# Patient Record
Sex: Male | Born: 1985 | Race: White | Hispanic: No | Marital: Single | Smoking: Current every day smoker
Health system: Southern US, Community
[De-identification: ages and names within clinical notes are randomized; demographics above are authoritative.]

---

## 2005-09-27 ENCOUNTER — Emergency Department (HOSPITAL_COMMUNITY): Admission: EM | Admit: 2005-09-27 | Discharge: 2005-09-27 | Payer: Self-pay | Admitting: Emergency Medicine

## 2006-09-04 ENCOUNTER — Ambulatory Visit: Payer: Self-pay | Admitting: Cardiology

## 2006-09-04 ENCOUNTER — Ambulatory Visit: Payer: Self-pay

## 2006-09-04 LAB — CONVERTED CEMR LAB
Basophils Relative: 0.6 % (ref 0.0–1.0)
Hemoglobin: 16.4 g/dL (ref 13.0–17.0)
Lymphocytes Relative: 25.6 % (ref 12.0–46.0)
MCHC: 34.8 g/dL (ref 30.0–36.0)
Monocytes Relative: 10.7 % (ref 3.0–11.0)
Neutrophils Relative %: 56.1 % (ref 43.0–77.0)
RBC: 5.29 M/uL (ref 4.22–5.81)
WBC: 7.7 10*3/uL (ref 4.5–10.5)

## 2012-11-21 ENCOUNTER — Encounter (HOSPITAL_COMMUNITY)
Admission: EM | Disposition: A | Discharge status: Home / Self Care | Payer: Self-pay | Source: Home / Self Care | Attending: Emergency Medicine

## 2012-11-21 ENCOUNTER — Encounter (HOSPITAL_COMMUNITY): Payer: Self-pay | Admitting: Emergency Medicine

## 2012-11-21 ENCOUNTER — Encounter (HOSPITAL_COMMUNITY): Payer: Self-pay | Admitting: Anesthesiology

## 2012-11-21 ENCOUNTER — Emergency Department (HOSPITAL_COMMUNITY): Payer: Self-pay

## 2012-11-21 ENCOUNTER — Observation Stay (HOSPITAL_COMMUNITY)
Admission: EM | Admit: 2012-11-21 | Discharge: 2012-11-22 | Disposition: A | Discharge status: Home / Self Care | Payer: MEDICAID | Attending: Orthopedic Surgery | Admitting: Orthopedic Surgery

## 2012-11-21 ENCOUNTER — Emergency Department (HOSPITAL_COMMUNITY): Payer: Self-pay | Admitting: Anesthesiology

## 2012-11-21 DIAGNOSIS — Y990 Civilian activity done for income or pay: Secondary | ICD-10-CM | POA: Insufficient documentation

## 2012-11-21 DIAGNOSIS — W309XXA Contact with unspecified agricultural machinery, initial encounter: Secondary | ICD-10-CM | POA: Insufficient documentation

## 2012-11-21 DIAGNOSIS — S82202B Unspecified fracture of shaft of left tibia, initial encounter for open fracture type I or II: Secondary | ICD-10-CM

## 2012-11-21 DIAGNOSIS — Y9339 Activity, other involving climbing, rappelling and jumping off: Secondary | ICD-10-CM | POA: Insufficient documentation

## 2012-11-21 DIAGNOSIS — S82209B Unspecified fracture of shaft of unspecified tibia, initial encounter for open fracture type I or II: Principal | ICD-10-CM | POA: Insufficient documentation

## 2012-11-21 HISTORY — PX: I&D EXTREMITY: SHX5045

## 2012-11-21 HISTORY — PX: TIBIA IM NAIL INSERTION: SHX2516

## 2012-11-21 LAB — POCT I-STAT, CHEM 8
BUN: 13 mg/dL (ref 6–23)
Creatinine, Ser: 1 mg/dL (ref 0.50–1.35)
Glucose, Bld: 89 mg/dL (ref 70–99)
Hemoglobin: 13.9 g/dL (ref 13.0–17.0)
Potassium: 3.6 mEq/L (ref 3.5–5.1)
Sodium: 142 mEq/L (ref 135–145)

## 2012-11-21 LAB — CBC WITH DIFFERENTIAL/PLATELET
Basophils Absolute: 0.1 10*3/uL (ref 0.0–0.1)
Basophils Relative: 1 % (ref 0–1)
HCT: 41 % (ref 39.0–52.0)
Hemoglobin: 14.5 g/dL (ref 13.0–17.0)
Monocytes Absolute: 0.6 10*3/uL (ref 0.1–1.0)
Neutrophils Relative %: 70 % (ref 43–77)
Platelets: 225 10*3/uL (ref 150–400)
RBC: 4.84 MIL/uL (ref 4.22–5.81)
RDW: 13.8 % (ref 11.5–15.5)

## 2012-11-21 SURGERY — INSERTION, INTRAMEDULLARY ROD, TIBIA
Anesthesia: General | Site: Leg Lower | Laterality: Left | Wound class: Dirty or Infected

## 2012-11-21 MED ORDER — WARFARIN SODIUM 10 MG PO TABS
10.0000 mg | ORAL_TABLET | Freq: Once | ORAL | Status: AC
  Administered 2012-11-22: 10 mg via ORAL
  Filled 2012-11-21: qty 1

## 2012-11-21 MED ORDER — HYDROMORPHONE HCL PF 1 MG/ML IJ SOLN
1.0000 mg | Freq: Once | INTRAMUSCULAR | Status: AC
  Administered 2012-11-21: 1 mg via INTRAVENOUS
  Administered 2012-11-21: 2 mg via INTRAVENOUS

## 2012-11-21 MED ORDER — PATIENT'S GUIDE TO USING COUMADIN BOOK
Freq: Once | Status: AC
  Administered 2012-11-21
  Filled 2012-11-21: qty 1

## 2012-11-21 MED ORDER — MIDAZOLAM HCL 2 MG/2ML IJ SOLN: 0.5000 mg | Freq: Once | INTRAMUSCULAR | Status: DC | PRN

## 2012-11-21 MED ORDER — METOCLOPRAMIDE HCL 10 MG PO TABS: 5.0000 mg | ORAL_TABLET | Freq: Three times a day (TID) | ORAL | Status: DC | PRN

## 2012-11-21 MED ORDER — PROPOFOL 10 MG/ML IV BOLUS
INTRAVENOUS | Status: DC | PRN
  Administered 2012-11-21: 200 mg via INTRAVENOUS

## 2012-11-21 MED ORDER — OXYCODONE HCL 5 MG PO TABS
5.0000 mg | ORAL_TABLET | ORAL | Status: DC | PRN
  Administered 2012-11-22 (×5): 10 mg via ORAL
  Filled 2012-11-21 (×5): qty 2

## 2012-11-21 MED ORDER — SODIUM CHLORIDE 0.9 % IV SOLN
INTRAVENOUS | Status: DC | PRN
  Administered 2012-11-21 (×2): via INTRAVENOUS

## 2012-11-21 MED ORDER — FENTANYL CITRATE 0.05 MG/ML IJ SOLN
INTRAMUSCULAR | Status: AC
  Administered 2012-11-21: 100 ug via INTRAVENOUS
  Filled 2012-11-21: qty 2

## 2012-11-21 MED ORDER — ONDANSETRON HCL 4 MG/2ML IJ SOLN: 4.0000 mg | Freq: Four times a day (QID) | INTRAMUSCULAR | Status: DC | PRN

## 2012-11-21 MED ORDER — SODIUM CHLORIDE 0.9 % IR SOLN
Status: DC | PRN
  Administered 2012-11-21: 3000 mL

## 2012-11-21 MED ORDER — CEFAZOLIN SODIUM 1-5 GM-% IV SOLN
INTRAVENOUS | Status: DC | PRN
  Administered 2012-11-21: 1 g via INTRAVENOUS

## 2012-11-21 MED ORDER — 0.9 % SODIUM CHLORIDE (POUR BTL) OPTIME
TOPICAL | Status: DC | PRN
  Administered 2012-11-21: 1000 mL

## 2012-11-21 MED ORDER — VECURONIUM BROMIDE 10 MG IV SOLR
INTRAVENOUS | Status: DC | PRN
  Administered 2012-11-21: 10 mg via INTRAVENOUS

## 2012-11-21 MED ORDER — HYDROMORPHONE HCL PF 1 MG/ML IJ SOLN
INTRAMUSCULAR | Status: AC
  Administered 2012-11-21: 2 mg via INTRAVENOUS
  Filled 2012-11-21: qty 2

## 2012-11-21 MED ORDER — HYDROMORPHONE HCL PF 1 MG/ML IJ SOLN: 1.0000 mg | Freq: Once | INTRAMUSCULAR | Status: DC

## 2012-11-21 MED ORDER — WARFARIN VIDEO
Freq: Once | Status: AC
  Administered 2012-11-22: 14:00:00

## 2012-11-21 MED ORDER — GLYCOPYRROLATE 0.2 MG/ML IJ SOLN
INTRAMUSCULAR | Status: DC | PRN
  Administered 2012-11-21: 0.6 mg via INTRAVENOUS

## 2012-11-21 MED ORDER — FENTANYL CITRATE 0.05 MG/ML IJ SOLN
100.0000 ug | Freq: Once | INTRAMUSCULAR | Status: AC
  Administered 2012-11-21: 100 ug via INTRAVENOUS

## 2012-11-21 MED ORDER — LIDOCAINE HCL (CARDIAC) 20 MG/ML IV SOLN
INTRAVENOUS | Status: DC | PRN
  Administered 2012-11-21: 100 mg via INTRAVENOUS
  Administered 2012-11-21: 50 mg via INTRAVENOUS

## 2012-11-21 MED ORDER — DEXAMETHASONE SODIUM PHOSPHATE 4 MG/ML IJ SOLN
INTRAMUSCULAR | Status: DC | PRN
  Administered 2012-11-21: 12 mg via INTRAVENOUS

## 2012-11-21 MED ORDER — ONDANSETRON HCL 4 MG PO TABS
4.0000 mg | ORAL_TABLET | Freq: Four times a day (QID) | ORAL | Status: DC | PRN
  Administered 2012-11-22: 4 mg via ORAL
  Filled 2012-11-21: qty 1

## 2012-11-21 MED ORDER — HYDROMORPHONE HCL PF 1 MG/ML IJ SOLN
1.0000 mg | INTRAMUSCULAR | Status: DC | PRN
  Administered 2012-11-22 (×5): 1 mg via INTRAVENOUS
  Filled 2012-11-21 (×6): qty 1

## 2012-11-21 MED ORDER — SODIUM CHLORIDE 0.9 % IV SOLN: INTRAVENOUS | Status: DC

## 2012-11-21 MED ORDER — FENTANYL CITRATE 0.05 MG/ML IJ SOLN
INTRAMUSCULAR | Status: AC
  Administered 2012-11-21: 100 ug
  Filled 2012-11-21: qty 2

## 2012-11-21 MED ORDER — CEFAZOLIN SODIUM 1-5 GM-% IV SOLN
1.0000 g | Freq: Four times a day (QID) | INTRAVENOUS | Status: AC
  Administered 2012-11-22 (×3): 1 g via INTRAVENOUS
  Filled 2012-11-21 (×3): qty 50

## 2012-11-21 MED ORDER — MEPERIDINE HCL 25 MG/ML IJ SOLN: 6.2500 mg | INTRAMUSCULAR | Status: DC | PRN

## 2012-11-21 MED ORDER — CEFAZOLIN SODIUM 1-5 GM-% IV SOLN
1.0000 g | Freq: Once | INTRAVENOUS | Status: AC
  Administered 2012-11-21: 1 g via INTRAVENOUS
  Filled 2012-11-21: qty 50

## 2012-11-21 MED ORDER — HYDROCODONE-ACETAMINOPHEN 10-325 MG PO TABS
1.0000 | ORAL_TABLET | ORAL | Status: DC | PRN
  Administered 2012-11-22 (×4): 2 via ORAL
  Filled 2012-11-21 (×4): qty 2

## 2012-11-21 MED ORDER — SODIUM CHLORIDE 0.9 % IV BOLUS (SEPSIS)
1000.0000 mL | Freq: Once | INTRAVENOUS | Status: AC
  Administered 2012-11-21: 1000 mL via INTRAVENOUS

## 2012-11-21 MED ORDER — OXYCODONE HCL 5 MG/5ML PO SOLN: 5.0000 mg | Freq: Once | ORAL | Status: AC | PRN

## 2012-11-21 MED ORDER — HYDROMORPHONE HCL PF 1 MG/ML IJ SOLN
INTRAMUSCULAR | Status: DC | PRN
  Administered 2012-11-21 (×4): .25 mg via INTRAVENOUS

## 2012-11-21 MED ORDER — OXYCODONE HCL 5 MG PO TABS
ORAL_TABLET | ORAL | Status: AC
  Filled 2012-11-21: qty 1

## 2012-11-21 MED ORDER — ARTIFICIAL TEARS OP OINT
TOPICAL_OINTMENT | OPHTHALMIC | Status: DC | PRN
  Administered 2012-11-21: 1 via OPHTHALMIC

## 2012-11-21 MED ORDER — WARFARIN - PHARMACIST DOSING INPATIENT: Freq: Every day | Status: DC

## 2012-11-21 MED ORDER — HYDROMORPHONE HCL PF 1 MG/ML IJ SOLN
0.2500 mg | INTRAMUSCULAR | Status: DC | PRN
  Administered 2012-11-21 (×4): 0.5 mg via INTRAVENOUS

## 2012-11-21 MED ORDER — FENTANYL CITRATE 0.05 MG/ML IJ SOLN
INTRAMUSCULAR | Status: DC | PRN
  Administered 2012-11-21: 150 ug via INTRAVENOUS
  Administered 2012-11-21: 250 ug via INTRAVENOUS
  Administered 2012-11-21: 100 ug via INTRAVENOUS

## 2012-11-21 MED ORDER — METOCLOPRAMIDE HCL 5 MG/ML IJ SOLN: 5.0000 mg | Freq: Three times a day (TID) | INTRAMUSCULAR | Status: DC | PRN

## 2012-11-21 MED ORDER — HYDROMORPHONE HCL PF 1 MG/ML IJ SOLN
INTRAMUSCULAR | Status: AC
  Filled 2012-11-21: qty 1

## 2012-11-21 MED ORDER — LACTATED RINGERS IV SOLN
INTRAVENOUS | Status: DC | PRN
  Administered 2012-11-21 (×2): via INTRAVENOUS

## 2012-11-21 MED ORDER — PROMETHAZINE HCL 25 MG/ML IJ SOLN: 6.2500 mg | INTRAMUSCULAR | Status: DC | PRN

## 2012-11-21 MED ORDER — ONDANSETRON HCL 4 MG/2ML IJ SOLN
INTRAMUSCULAR | Status: DC | PRN
  Administered 2012-11-21: 4 mg via INTRAVENOUS

## 2012-11-21 MED ORDER — NEOSTIGMINE METHYLSULFATE 1 MG/ML IJ SOLN
INTRAMUSCULAR | Status: DC | PRN
  Administered 2012-11-21: 5 mg via INTRAVENOUS

## 2012-11-21 MED ORDER — DEXTROSE 5 % IV SOLN
INTRAVENOUS | Status: DC | PRN
  Administered 2012-11-21: 20:00:00 via INTRAVENOUS

## 2012-11-21 MED ORDER — HYDROMORPHONE HCL PF 1 MG/ML IJ SOLN
INTRAMUSCULAR | Status: AC
  Administered 2012-11-22: 1 mg
  Filled 2012-11-21: qty 1

## 2012-11-21 MED ORDER — MIDAZOLAM HCL 5 MG/5ML IJ SOLN
INTRAMUSCULAR | Status: DC | PRN
  Administered 2012-11-21: 2 mg via INTRAVENOUS

## 2012-11-21 MED ORDER — OXYCODONE HCL 5 MG PO TABS
5.0000 mg | ORAL_TABLET | Freq: Once | ORAL | Status: AC | PRN
  Administered 2012-11-21: 5 mg via ORAL

## 2012-11-21 MED ORDER — SUCCINYLCHOLINE CHLORIDE 20 MG/ML IJ SOLN
INTRAMUSCULAR | Status: DC | PRN
  Administered 2012-11-21: 100 mg via INTRAVENOUS

## 2012-11-21 SURGICAL SUPPLY — 60 items
BANDAGE ELASTIC 6 VELCRO ST LF (GAUZE/BANDAGES/DRESSINGS) ×1 IMPLANT
BANDAGE GAUZE ELAST BULKY 4 IN (GAUZE/BANDAGES/DRESSINGS) ×2 IMPLANT
BIT DRILL LONG 4.0 (BIT) IMPLANT
BIT DRILL SHORT 4.0 (BIT) IMPLANT
BLADE SURG 10 STRL SS (BLADE) ×1 IMPLANT
BNDG COHESIVE 4X5 TAN STRL (GAUZE/BANDAGES/DRESSINGS) ×1 IMPLANT
BNDG COHESIVE 6X5 TAN STRL LF (GAUZE/BANDAGES/DRESSINGS) ×4 IMPLANT
CLOTH BEACON ORANGE TIMEOUT ST (SAFETY) ×2 IMPLANT
CO AXIAL FAN SPRAY TIP SOFT SH (MISCELLANEOUS) ×1 IMPLANT
COVER SURGICAL LIGHT HANDLE (MISCELLANEOUS) ×4 IMPLANT
CUFF TOURNIQUET SINGLE 34IN LL (TOURNIQUET CUFF) IMPLANT
CUFF TOURNIQUET SINGLE 44IN (TOURNIQUET CUFF) IMPLANT
DRAPE C-ARM 42X72 X-RAY (DRAPES) ×1 IMPLANT
DRAPE C-ARM MINI 42X72 WSTRAPS (DRAPES) ×1 IMPLANT
DRAPE U-SHAPE 47X51 STRL (DRAPES) ×2 IMPLANT
DRILL BIT LONG 4.0 (BIT) ×2
DRILL BIT SHORT 4.0 (BIT) ×4
DRSG ADAPTIC 3X8 NADH LF (GAUZE/BANDAGES/DRESSINGS) ×2 IMPLANT
DRSG PAD ABDOMINAL 8X10 ST (GAUZE/BANDAGES/DRESSINGS) ×1 IMPLANT
ELECT REM PT RETURN 9FT ADLT (ELECTROSURGICAL) ×2
ELECTRODE REM PT RTRN 9FT ADLT (ELECTROSURGICAL) ×1 IMPLANT
FLUID NSS /IRRIG 3000 ML XXX (IV SOLUTION) ×1 IMPLANT
GLOVE BIOGEL PI IND STRL 9 (GLOVE) ×1 IMPLANT
GLOVE BIOGEL PI INDICATOR 9 (GLOVE) ×1
GLOVE SURG ORTHO 9.0 STRL STRW (GLOVE) ×2 IMPLANT
GOWN PREVENTION PLUS XLARGE (GOWN DISPOSABLE) ×2 IMPLANT
GOWN SRG XL XLNG 56XLVL 4 (GOWN DISPOSABLE) ×2 IMPLANT
GOWN STRL NON-REIN XL XLG LVL4 (GOWN DISPOSABLE) ×4
GUIDE PIN 3.2MM (MISCELLANEOUS) ×2
GUIDE PIN ORTH 343X3.2XBRAD (MISCELLANEOUS) IMPLANT
GUIDE ROD 3.0 (MISCELLANEOUS) ×2
HANDPIECE INTERPULSE COAX TIP (DISPOSABLE) ×2
KIT BASIN OR (CUSTOM PROCEDURE TRAY) ×2 IMPLANT
KIT ROOM TURNOVER OR (KITS) ×2 IMPLANT
NAIL TIBIAL 10X37 (Nail) ×1 IMPLANT
NS IRRIG 1000ML POUR BTL (IV SOLUTION) ×2 IMPLANT
PACK ORTHO EXTREMITY (CUSTOM PROCEDURE TRAY) ×2 IMPLANT
PAD ARMBOARD 7.5X6 YLW CONV (MISCELLANEOUS) ×4 IMPLANT
PAD CAST 4YDX4 CTTN HI CHSV (CAST SUPPLIES) ×1 IMPLANT
PADDING CAST COTTON 4X4 STRL (CAST SUPPLIES)
PENCIL BUTTON HOLSTER BLD 10FT (ELECTRODE) ×2 IMPLANT
ROD GUIDE 3.0 (MISCELLANEOUS) IMPLANT
SCREW TRIGEN LOW PROF 5.0X32.5 (Screw) ×1 IMPLANT
SCREW TRIGEN LOW PROF 5.0X45 (Screw) ×1 IMPLANT
SET HNDPC FAN SPRY TIP SCT (DISPOSABLE) IMPLANT
SPONGE GAUZE 4X4 12PLY (GAUZE/BANDAGES/DRESSINGS) ×2 IMPLANT
SPONGE LAP 18X18 X RAY DECT (DISPOSABLE) ×2 IMPLANT
STAPLER VISISTAT 35W (STAPLE) ×2 IMPLANT
STOCKINETTE IMPERVIOUS LG (DRAPES) ×2 IMPLANT
SUCTION FRAZIER TIP 10 FR DISP (SUCTIONS) ×1 IMPLANT
SUT ETHILON 2 0 FS 18 (SUTURE) ×2 IMPLANT
SUT ETHILON 2 0 PSLX (SUTURE) ×1 IMPLANT
SUT VIC AB 0 CTB1 27 (SUTURE) ×2 IMPLANT
SUT VIC AB 2-0 CTB1 (SUTURE) ×2 IMPLANT
SYR BULB IRRIGATION 50ML (SYRINGE) ×1 IMPLANT
TOWEL OR 17X24 6PK STRL BLUE (TOWEL DISPOSABLE) ×2 IMPLANT
TOWEL OR 17X26 10 PK STRL BLUE (TOWEL DISPOSABLE) ×2 IMPLANT
TUBE CONNECTING 12X1/4 (SUCTIONS) ×2 IMPLANT
WATER STERILE IRR 1000ML POUR (IV SOLUTION) ×1 IMPLANT
YANKAUER SUCT BULB TIP NO VENT (SUCTIONS) ×1 IMPLANT

## 2012-11-21 NOTE — ED Notes (Addendum)
Pt presents to ED via EMS . Pt was cutting wood and a large log  hit his left leg. PT has open fracture to left lower leg. EMS gave 250 fentanyl placed 2 PIV's. EMS states EBL at 50-100.

## 2012-11-21 NOTE — Progress Notes (Signed)
ANTICOAGULATION CONSULT NOTE - Initial Consult  Pharmacy Consult for coumadin Indication: VTE prophylaxis  No Known Allergies  Patient Measurements: Height: 6\' 2"  (188 cm) (per pt report) Weight: 185 lb (83.915 kg) (per pt report) IBW/kg (Calculated) : 82.2   Vital Signs: Temp: 97.8 F (36.6 C) (05/25 2134) Temp src: Oral (05/25 1745) BP: 125/63 mmHg (05/25 2145) Pulse Rate: 57 (05/25 2145)  Labs:  Recent Labs  11/21/12 1752 11/21/12 1802  HGB 14.5 13.9  HCT 41.0 41.0  PLT 225  --   CREATININE  --  1.00    Estimated Creatinine Clearance: 127.9 ml/min (by C-G formula based on Cr of 1).   Medical History: History reviewed. No pertinent past medical history.  Medications:  No prescriptions prior to admission    Assessment: Douglas Cummings is a 27 yo man s/p IM nail tibia to repair open tib/fib fracture sustained while cutting down a tree.  Pharmacy has been asked to dose coumadin for VTE px.   He states his ht is 12ft2in and his wt is 185 pounds.   Baseline CBC wnl.  Goal of Therapy:  INR 2-3 Monitor platelets by anticoagulation protocol: Yes   Plan:  1. Coumadin 10 mg po x 1 dose tonight 2. Daily INR 3. Coumadin book and video for education Herby Abraham, Pharm.D. [phone] 11/21/2012 10:04 PM

## 2012-11-21 NOTE — Progress Notes (Signed)
Orthopedic Tech Progress Note Patient Details:  Douglas Cummings [birth year] [id number]  Patient ID: Douglas Cummings, male   DOB: 10/10/1985, 27 y.o.   MRN: [id number] Made level 2 trauma visit  Nikki Dom 11/21/2012, 5:39 PM

## 2012-11-21 NOTE — Transfer of Care (Signed)
Immediate Anesthesia Transfer of Care Note  Patient: Senai Kingsley Storr  Procedure(s) Performed: Procedure(s) with comments: INTRAMEDULLARY (IM) NAIL TIBIAL (Left) IRRIGATION AND DEBRIDEMENT EXTREMITY (Left) - open wound left tibia  Patient Location: PACU  Anesthesia Type:General  Level of Consciousness: oriented, sedated, patient cooperative and responds to stimulation  Airway & Oxygen Therapy: Patient Spontanous Breathing and Patient connected to nasal cannula oxygen  Post-op Assessment: Report given to PACU RN, Post -op Vital signs reviewed and stable, Patient moving all extremities and Patient moving all extremities X 4  Post vital signs: Reviewed and stable  Complications: No apparent anesthesia complications

## 2012-11-21 NOTE — Anesthesia Postprocedure Evaluation (Signed)
  Anesthesia Post-op Note  Patient: Douglas Cummings  Procedure(s) Performed: Procedure(s) with comments: INTRAMEDULLARY (IM) NAIL TIBIAL (Left) IRRIGATION AND DEBRIDEMENT EXTREMITY (Left) - open wound left tibia  Patient Location: PACU  Anesthesia Type:General  Level of Consciousness: sedated, patient cooperative and responds to stimulation  Airway and Oxygen Therapy: Patient Spontanous Breathing and Patient connected to nasal cannula oxygen  Post-op Pain: none  Post-op Assessment: Post-op Vital signs reviewed, Patient's Cardiovascular Status Stable, Respiratory Function Stable, Patent Airway, No signs of Nausea or vomiting and Pain level controlled  Post-op Vital Signs: Reviewed and stable  Complications: No apparent anesthesia complications

## 2012-11-21 NOTE — Anesthesia Procedure Notes (Signed)
Procedure Name: Intubation Date/Time: 11/21/2012 8:01 PM Performed by: Wray Kearns A Pre-anesthesia Checklist: Patient identified, Timeout performed, Emergency Drugs available, Suction available and Patient being monitored Patient Re-evaluated:Patient Re-evaluated prior to inductionOxygen Delivery Method: Circle system utilized Preoxygenation: Pre-oxygenation with 100% oxygen Intubation Type: IV induction, Rapid sequence and Cricoid Pressure applied Laryngoscope Size: Mac and 4 Grade View: Grade I Tube type: Oral Tube size: 7.5 mm Number of attempts: 1 Airway Equipment and Method: Stylet Placement Confirmation: ETT inserted through vocal cords under direct vision,  breath sounds checked- equal and bilateral and positive ETCO2 Secured at: 23 cm Tube secured with: Tape Dental Injury: Teeth and Oropharynx as per pre-operative assessment

## 2012-11-21 NOTE — ED Notes (Signed)
Patient removed off back board per Dr Adriana Simas

## 2012-11-21 NOTE — Progress Notes (Signed)
Orthopedic Tech Progress Note Patient Details:  Douglas Cummings [birth year] [id number]  Ortho Devices Type of Ortho Device: Ace wrap;Post (short leg) splint Ortho Device/Splint Location: left leg Ortho Device/Splint Interventions: Application   Douglas Cummings 11/21/2012, 7:00 PM

## 2012-11-21 NOTE — Preoperative (Signed)
Beta Blockers   Reason not to administer Beta Blockers:Not Applicable 

## 2012-11-21 NOTE — H&P (Signed)
Douglas Cummings is an 27 y.o. male.   Chief Complaint: Open left tib-fib fracture HPI: Patient is a 28 year old gentleman who states that he works as a Geophysical data processor. He states he was cutting down a tree and the tree stand back sustaining an open fracture to the left tib-fib. Patient denies any other trauma. Patient denies drinking alcohol.  History reviewed. No pertinent past medical history.  History reviewed. No pertinent past surgical history.  History reviewed. No pertinent family history. Social History:  has no tobacco, alcohol, and drug history on file.  Allergies: No Known Allergies   (Not in a hospital admission)  Results for orders placed during the hospital encounter of 11/21/12 (from the past 48 hour(s))  CBC WITH DIFFERENTIAL     Status: None   Collection Time    11/21/12  5:52 PM      Result Value Range   WBC 7.3  4.0 - 10.5 K/uL   RBC 4.84  4.22 - 5.81 MIL/uL   Hemoglobin 14.5  13.0 - 17.0 g/dL   HCT 16.1  09.6 - 04.5 %   MCV 84.7  78.0 - 100.0 fL   MCH 30.0  26.0 - 34.0 pg   MCHC 35.4  30.0 - 36.0 g/dL   RDW 40.9  81.1 - 91.4 %   Platelets 225  150 - 400 K/uL   Neutrophils Relative % 70  43 - 77 %   Neutro Abs 5.1  1.7 - 7.7 K/uL   Lymphocytes Relative 19  12 - 46 %   Lymphs Abs 1.4  0.7 - 4.0 K/uL   Monocytes Relative 9  3 - 12 %   Monocytes Absolute 0.6  0.1 - 1.0 K/uL   Eosinophils Relative 1  0 - 5 %   Eosinophils Absolute 0.1  0.0 - 0.7 K/uL   Basophils Relative 1  0 - 1 %   Basophils Absolute 0.1  0.0 - 0.1 K/uL  POCT I-STAT, CHEM 8     Status: Abnormal   Collection Time    11/21/12  6:02 PM      Result Value Range   Sodium 142  135 - 145 mEq/L   Potassium 3.6  3.5 - 5.1 mEq/L   Chloride 107  96 - 112 mEq/L   BUN 13  6 - 23 mg/dL   Creatinine, Ser 7.82  0.50 - 1.35 mg/dL   Glucose, Bld 89  70 - 99 mg/dL   Calcium, Ion 9.56 (*) 1.12 - 1.23 mmol/L   TCO2 26  0 - 100 mmol/L   Hemoglobin 13.9  13.0 - 17.0 g/dL   HCT 21.3  08.6 - 57.8 %   Dg  Tibia/fibula Left Port  11/21/2012   *RADIOLOGY REPORT*  Clinical Data: Trauma. Open fracture.  PORTABLE LEFT TIBIA AND FIBULA - 2 VIEW  Comparison: None  Findings: There are comminuted fractures of the tibia fibula at the junction of the mid and distal thirds.  There is resulting valgus and posterior angulation.  The ankle and knee appear intact.  IMPRESSION: Comminuted tibial and fibular fractures.   Original Report Authenticated By: Norva Pavlov, M.D.    Review of Systems  All other systems reviewed and are negative.    Blood pressure 130/55, pulse 76, temperature 97.7 F (36.5 C), temperature source Oral, resp. rate 24, SpO2 100.00%. Physical Exam  On examination patient has an open laceration over the tib-fib. He has a good dorsalis pedis pulse. The calf is soft. Patient's has  good motor function in the left lower extremity has normal sensation. Review of the radiographs shows a mid-diaphyseal fracture of the left tib-fib. Patient complains of chronic lower back pain which he self medicates. Assessment/Plan Assessment: Open type IIIA left tib-fib fracture.  Plan: Will plan for irrigation debridement skin soft tissue muscle and bone. Placement of an intramedullary nail. Risks and benefits were discussed including infection neurovascular injury pain DVT pulmonary embolus need for additional surgery. Patient states he understands and wished to proceed at this time. Patient states that he takes Suboxone from a friend and we may have difficulty with postoperative pain control.   DUDA,MARCUS V 11/21/2012, 7:27 PM

## 2012-11-21 NOTE — Progress Notes (Signed)
Orthopedic Tech Progress Note Patient Details:  Douglas Cummings 03-Jan-1986 [id number]  Ortho Devices Type of Ortho Device: CAM walker Ortho Device/Splint Location: left foot Ortho Device/Splint Interventions: Application   Douglas Cummings 11/21/2012, 10:35 PM

## 2012-11-21 NOTE — Op Note (Signed)
OPERATIVE REPORT  DATE OF SURGERY: 11/21/2012  PATIENT:  Douglas Cummings,  27 y.o. male  PRE-OPERATIVE DIAGNOSIS:  open tib/fib fracture type IIIa  POST-OPERATIVE DIAGNOSIS:  open tib/fib fracture type IIIA  PROCEDURE:  Procedure(s): INTRAMEDULLARY (IM) NAIL TIBIAL [employer] nail 10 x 37 cm Locked proximally with a 45 mm screw. Locked distally with a 32.5 mm screw. Irrigation debridement skin soft tissue and bone with excision of loose bone fragments. Local tissue rearrangement for closure of traumatic wound 6 cm x 3 cm IRRIGATION AND DEBRIDEMENT EXTREMITY  SURGEON:  Surgeon(s): Nadara Mustard, MD  ANESTHESIA:   general  EBL:  Minimal ML  SPECIMEN:  No Specimen  TOURNIQUET:  * No tourniquets in log *  PROCEDURE DETAILS: Patient is a 27 year old gentleman who states he was cutting down a tree. The limb snapback struck him in his leg and he sustained an open type IIIa tib-fib fracture left leg. Patient was brought to the emergency room he had good pulses the compartments were soft he had an open type IIIa fracture radiographs were reviewed and patient presents at this time for urgent intervention. Patient received 1 g of Kefzol in the emergency room and 1 g Kefzol preoperatively. Risks and benefits were discussed with the patient including infection neurovascular injury pain DVT pulmonary embolus and need for additional surgery. Patient states he understands and wished to proceed at this time. Description of procedure patient brought to the operating room and underwent a general anesthetic. After adequate levels of anesthesia were obtained patient's left lower extremity was prepped using DuraPrep draped into a sterile field. The wound was first debrided of skin soft tissue a loose bony fragments. It was irrigated with pulsatile lavage 3 L. Necrotic tissue was removed there was no contamination of dirt. The greater saphenous vein was injured by the trauma and this was ligated. After  irrigation debridement of the traumatic wound. A incision was made proximal medial to the patella. A starting guidepin was inserted into the anterior aspect the tibia the alignment was verified in AP and lateral planes this was overdrilled and a guidewire was inserted down across the fracture site. This was sequentially reamed to 11.5 mm for a 10 mm nail. A 10 x 37 cm nail was inserted C-arm fluoroscopy verified alignment. This nail was locked proximally with a 45 mm screw and using free hand technique was locked distally after rotation was checked with a 32.5 mm screw. C-arm fluoroscopy verified alignment of both AP and lateral planes. The local tissue was rearranged 3 x 6 cm to close the traumatic wound. 2-0 nylon was used to close all wounds. Wounds were covered with Adaptic orthopedic sponges AB dressing Kerlix and Coban. Patient was extubated taken to the PACU in stable condition.  PLAN OF CARE: Admit to inpatient   PATIENT DISPOSITION:  PACU - hemodynamically stable.   Nadara Mustard, MD 11/21/2012 9:15 PM

## 2012-11-21 NOTE — Anesthesia Preprocedure Evaluation (Signed)
Anesthesia Evaluation  Patient identified by MRN, date of birth, ID band Patient awake    Reviewed: Allergy & Precautions, H&P , NPO status , Patient's Chart, lab work & pertinent test results  History of Anesthesia Complications Negative for: history of anesthetic complications  Airway Mallampati: I TM Distance: >3 FB Neck ROM: Full    Dental  (+) Dental Advisory Given and Chipped   Pulmonary Current Smoker,  breath sounds clear to auscultation  Pulmonary exam normal       Cardiovascular negative cardio ROS  Rhythm:Regular Rate:Normal     Neuro/Psych    GI/Hepatic Neg liver ROS, GERD-  Controlled,  Endo/Other  negative endocrine ROS  Renal/GU negative Renal ROS     Musculoskeletal   Abdominal   Peds  Hematology   Anesthesia Other Findings   Reproductive/Obstetrics                           Anesthesia Physical Anesthesia Plan  ASA: II and emergent  Anesthesia Plan: General   Post-op Pain Management:    Induction: Intravenous  Airway Management Planned: Oral ETT  Additional Equipment:   Intra-op Plan:   Post-operative Plan: Extubation in OR  Informed Consent: I have reviewed the patients History and Physical, chart, labs and discussed the procedure including the risks, benefits and alternatives for the proposed anesthesia with the patient or authorized representative who has indicated his/her understanding and acceptance.   Dental advisory given  Plan Discussed with: Surgeon and CRNA  Anesthesia Plan Comments: (Plan routine monitors, GETA with RSI)        Anesthesia Quick Evaluation

## 2012-11-21 NOTE — Addendum Note (Signed)
Addendum created 11/21/12 2159 by Elisabeth Most, CRNA   Modules edited: Anesthesia Events

## 2012-11-21 NOTE — ED Provider Notes (Signed)
History     CSN: [id number]  Arrival date & time 11/21/12  1738   First MD Initiated Contact with Patient 11/21/12 1750      Chief Complaint  Patient presents with  . 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 He was standing at ground level when he was struck in the left lower leg by machinery equipment. There is no head or neck injury no back injury he has chronic stable severe low back pain and uses street narcotics for his back pain, he denies any chest injury chest pain shortness breath abdominal pain vomiting or recent illnesses, he is no injury to his arms or his right leg, he is no pain to his left hip or knee. His pain is severe and unresponsive to 250 mcg of fentanyl from EMS prior to arrival. His last tetanus shot is within the last 10 years. His last oral intake was 2:00 this morning. He has been healthy recently. History reviewed. No pertinent past medical history. Chronic back pain, chronic street drug and narcotic use History reviewed. No pertinent past surgical history.  History reviewed. No pertinent family history.  History  Substance Use Topics  . Smoking status: Not on file  . Smokeless tobacco: Not on file  . Alcohol Use: Not on file   patient smokes and is to discontinue uses street narcotics and denies alcohol use    Review of Systems 10 Systems reviewed and are negative for acute change except as noted in the HPI. Allergies  Review of patient's allergies indicates no known allergies.  Home Medications   Current Outpatient Rx  Name  Route  Sig  Dispense  Refill  .  oxyCODONE-acetaminophen (PERCOCET) 10-325 MG per tablet   Oral   Take 1 tablet by mouth every 4 (four) hours as needed for pain.   60 tablet   0     BP 131/59  Pulse 83  Temp(Src) 98.9 F (37.2 C) (Oral)  Resp 16  Ht 6\' 2"  (1.88 m)  Wt 185 lb (83.915 kg)  BMI 23.74 kg/m2  SpO2 97%  Physical Exam  Nursing note and vitals reviewed. Constitutional:  Awake, alert, nontoxic appearance.  HENT:  Head: Atraumatic.  Eyes: Right eye exhibits no discharge. Left eye exhibits no discharge.  Neck: Neck supple.  Cervical spine nontender  Cardiovascular: Normal rate and regular rhythm.   No murmur heard. Pulmonary/Chest: Effort normal and breath sounds normal. No respiratory distress. He has no wheezes. He has no rales. He exhibits no tenderness.  Abdominal: Soft. Bowel sounds are normal. He exhibits no distension and no mass. There is no tenderness. There is no rebound and no guarding.  Musculoskeletal: He exhibits tenderness.  Both arms and right leg nontender with good movement. Left leg is no tenderness to the hip thigh knee ankle or foot. Left lower leg has obvious open tibia-fibula fracture with several centimeter open component not grossly contaminated with no active bleeding, left foot and ankle intact dorsalis pedis and posterior tibial pulses palpable with capillary refill less than 2 seconds in all toes normal light touch to the entire foot and the patient is able to dorsiflex and plantar  flex the toes of his left foot despite his lower leg pain from his fractures  Neurological: He is alert.  Mental status and motor strength appears baseline for patient and situation.  Skin: No rash noted.  Psychiatric: He has a normal mood and affect.    ED Course  Procedures (including critical care time) D/w Ortho for admit. ~1830  Analgesia, antibiotics, splint with stressing ordered. Procedure: Splint application timeout, with the assistance of the orthopedic technician I applied a quick  clot dressing with saline and Betadine gauze over his open fracture wrapped with Kerlix then Webroll, then applied a short leg posterior splint then Ace wrap, with capillary refill less than 2 seconds normal light touch and patient still able to wiggle his toes before and after splint placement, patient tolerated procedure without apparent immediate complications,  Splinted leg elevated resting on pillows and patient taken to operating room. Labs Reviewed  POCT I-STAT, CHEM 8 - Abnormal; Notable for the following:    Calcium, Ion 1.10 (*)    All other components within normal limits  CBC WITH DIFFERENTIAL  PROTIME-INR   Dg Tibia/fibula Left  11/22/2012   *RADIOLOGY REPORT*  Clinical Data: IM nail of the left tibia  DG C-ARM 61-120 MIN, LEFT TIBIA AND FIBULA - 2 VIEW  Comparison:  Left tibia and fibula radiographs - earlier same day  Findings:  Six spot fluoroscopic intraoperative images of the left tibia and fibula are provided for review.  Images demonstrate intramedullary rod fixation of previously noted comminuted, displaced midshaft tibial fracture.  Alignment appears near anatomic.  Improved alignment of previously noted adjacent mid shaft comminuted fibular fracture without dedicated ORIF.  No definite radiopaque foreign bodies.  IMPRESSION: Post IM rod fixation of previously noted comminuted midshaft tibial fracture.   Original Report Authenticated By: Tacey Ruiz, MD   Dg Tibia/fibula Left Port  11/21/2012   *RADIOLOGY REPORT*  Clinical Data: Trauma. Open fracture.  PORTABLE LEFT TIBIA AND FIBULA - 2 VIEW  Comparison: None  Findings: There are comminuted fractures of the tibia fibula at the junction of the mid and distal thirds.  There is resulting valgus and posterior angulation.  The ankle and knee appear intact.  IMPRESSION: Comminuted tibial and fibular fractures.   Original Report Authenticated By: Norva Pavlov, M.D.   Dg C-arm [phone] Min  11/22/2012   *RADIOLOGY REPORT*  Clinical  Data: IM nail of the left tibia  DG C-ARM 61-120 MIN, LEFT TIBIA AND FIBULA - 2 VIEW  Comparison:  Left tibia and fibula radiographs - earlier same day  Findings:  Six spot fluoroscopic intraoperative images of the left tibia and fibula are provided for review.  Images demonstrate intramedullary rod fixation of previously noted comminuted, displaced midshaft tibial fracture.  Alignment appears near anatomic.  Improved alignment of previously noted adjacent mid shaft comminuted fibular fracture without dedicated ORIF.  No definite radiopaque foreign bodies.  IMPRESSION: Post IM rod fixation of previously noted comminuted midshaft tibial fracture.   Original Report Authenticated By: Tacey Ruiz, MD     1. Open fracture of left tibia and fibula, type I or II, initial encounter       MDM  The patient appears reasonably stabilized for admission considering the current resources, flow, and capabilities available in the ED at this time, and I doubt any other [facility] requiring further screening and/or treatment in the ED prior to admission.        Hurman Horn, MD 11/22/12 1440

## 2012-11-22 LAB — PROTIME-INR
INR: 1.07 (ref 0.00–1.49)
Prothrombin Time: 13.8 seconds (ref 11.6–15.2)

## 2012-11-22 MED ORDER — WARFARIN SODIUM 10 MG PO TABS
10.0000 mg | ORAL_TABLET | Freq: Once | ORAL | Status: DC
  Filled 2012-11-22: qty 1

## 2012-11-22 MED ORDER — OXYCODONE-ACETAMINOPHEN 10-325 MG PO TABS
1.0000 | ORAL_TABLET | ORAL | Status: AC | PRN
Start: 2012-11-22 — End: ?

## 2012-11-22 NOTE — Progress Notes (Signed)
ANTICOAGULATION CONSULT NOTE  Pharmacy Consult for coumadin Indication: VTE prophylaxis  No Known Allergies  Patient Measurements: Height: 6\' 2"  (188 cm) (per pt report) Weight: 185 lb (83.915 kg) (per pt report) IBW/kg (Calculated) : 82.2   Vital Signs: Temp: 98.9 F (37.2 C) (05/26 0500) BP: 131/59 mmHg (05/26 0500) Pulse Rate: 83 (05/26 0500)  Labs:  Recent Labs  11/21/12 1752 11/21/12 1802 11/22/12 0440  HGB 14.5 13.9  --   HCT 41.0 41.0  --   PLT 225  --   --   LABPROT  --   --  13.8  INR  --   --  1.07  CREATININE  --  1.00  --     Estimated Creatinine Clearance: 127.9 ml/min (by C-G formula based on Cr of 1).   Medical History: History reviewed. No pertinent past medical history.  Medications:  No prescriptions prior to admission    Assessment: Mr. Cush is a 27 yo man s/p IM nail tibia to repair open tib/fib fracture sustained while cutting down a tree.  Pharmacy has been asked to dose coumadin for VTE px.   INR is likely still at baseline at 1.0 this morning after 10mg  dose given last night. No issues noted this morning   Goal of Therapy:  INR 2-3 Monitor platelets by anticoagulation protocol: Yes   Plan:  1. Coumadin 10 mg po x 1 dose tonight 2. Daily INR 3. Coumadin book and video for education  Sheppard Coil PharmD., BCPS Clinical Pharmacist Pager [phone] 11/22/2012 10:04 AM

## 2012-11-22 NOTE — Progress Notes (Signed)
UR COMPLETED  

## 2012-11-22 NOTE — Evaluation (Signed)
Physical Therapy Evaluation Patient Details Name: Douglas Cummings MRN: [id number] DOB: [birth year] Today's Date: 11/22/2012 Time: 6213-0865 PT Time Calculation (min): 23 min  PT Assessment / Plan / Recommendation Clinical Impression  Pt is a 27 y.o. s/p L Im nail in tibia and I & D of L tibia due to tree accident. Pt very implusive; refuses any needs for PT at this level of care. Recommend RW and pt prfers crutches. Pt given education on safety with amb and transfers. Will recommend OPPT when WB status changed. Denies all needs for DME.     PT Assessment  Patent does not need any further PT services    Follow Up Recommendations  Outpatient PT;Supervision - Intermittent;Supervision for mobility/OOB    Does the patient have the potential to tolerate intense rehabilitation      Barriers to Discharge        Equipment Recommendations  None recommended by PT    Recommendations for Other Services     Frequency      Precautions / Restrictions Precautions Precautions: Fall Required Braces or Orthoses: Other Brace/Splint Other Brace/Splint: CAM boot Restrictions Weight Bearing Restrictions: Yes LLE Weight Bearing: Non weight bearing   Pertinent Vitals/Pain 10/10 pre medicated       Mobility  Bed Mobility Bed Mobility: Supine to Sit;Sitting - Scoot to Edge of Bed Supine to Sit: 6: Modified independent (Device/Increase time) Sitting - Scoot to Edge of Bed: 6: Modified independent (Device/Increase time) Details for Bed Mobility Assistance: impulsive but demo good technique  Transfers Transfers: Sit to Stand;Stand to Sit Sit to Stand: 4: Min guard;From bed Stand to Sit: 5: Supervision;To chair/3-in-1 Details for Transfer Assistance: pt requires cues and assitance intially to steady due to impulsiveness and decreased safety awareness with crutches; given demo and instructions on safe transfer technique; pt verbalized understanding  Ambulation/Gait Ambulation/Gait Assistance: 4:  Min guard Ambulation Distance (Feet): 100 Feet Assistive device: Crutches Ambulation/Gait Assistance Details: pt unsteady with crutches; recommend RW but pt refused. Given demo and educated on proper technique; pt is impulsive and  unsafe but does not wish to follow directions or cues at this time  Gait Pattern: Trunk flexed (hop to ) Gait velocity: decreased  Stairs: No Wheelchair Mobility Wheelchair Mobility: No    Exercises General Exercises - Lower Extremity Ankle Circles/Pumps: Both;AROM;10 reps   PT Diagnosis:    PT Problem List:   PT Treatment Interventions:     PT Goals Acute Rehab PT Goals PT Goal Formulation: With patient  Visit Information  Last PT Received On: 11/22/12 Assistance Needed: +1    Subjective Data  Subjective: Pt lying supine with friend present; in no acute distress; agreable to therapy; plans to D/C today  Patient Stated Goal: D/C home with mom    Prior Functioning  Home Living Lives With: Alone Available Help at Discharge: Family;Available 24 hours/day Type of Home: House Home Access: Level entry Home Layout: One level Bathroom Shower/Tub: Tub/shower unit;Walk-in shower (walk in shower has chair ) Bathroom Toilet: Standard Bathroom Accessibility: Yes How Accessible: Accessible via walker Home Adaptive Equipment: None Prior Function Level of Independence: Independent Able to Take Stairs?: Yes Driving: Yes Vocation: Full time employment Communication Communication: No difficulties Dominant Hand: Right    Cognition  Cognition Arousal/Alertness: Awake/alert Behavior During Therapy: WFL for tasks assessed/performed Overall Cognitive Status: Within Functional Limits for tasks assessed    Extremity/Trunk Assessment Right Lower Extremity Assessment RLE ROM/Strength/Tone: WFL for tasks assessed RLE Sensation: WFL - Light Touch Left Lower  Extremity Assessment LLE ROM/Strength/Tone: Unable to fully assess;Due to precautions (performed SLR  with L LE ) LLE Sensation: Deficits LLE Sensation Deficits: "numbness" in L foot Trunk Assessment Trunk Assessment: Normal   Balance Balance Balance Assessed: Yes Static Standing Balance Static Standing - Balance Support: Bilateral upper extremity supported;During functional activity Static Standing - Level of Assistance: 5: Stand by assistance Static Standing - Comment/# of Minutes: during ADLs; required cues for safety   End of Session PT - End of Session Equipment Utilized During Treatment: Gait belt;Other (comment) (CAM boot ) Activity Tolerance: Patient tolerated treatment well Patient left: in chair;with call bell/phone within reach;with family/visitor present Nurse Communication: Mobility status  GP Functional Assessment Tool Used: clinical judgement  Functional Limitation: Mobility: Walking and moving around Mobility: Walking and Moving Around Current Status (W2956): At least 1 percent but less than 20 percent impaired, limited or restricted Mobility: Walking and Moving Around Goal Status [phone]): At least 1 percent but less than 20 percent impaired, limited or restricted Mobility: Walking and Moving Around Discharge Status [phone]): At least 1 percent but less than 20 percent impaired, limited or restricted   Donell Sievert, [place] [phone] 11/22/2012, 1:02 PM

## 2012-11-22 NOTE — Discharge Summary (Signed)
Physician Discharge Summary  Patient ID: Douglas Cummings MRN: [id number] DOB/AGE: 26-Sep-1985 27 y.o.  Admit date: 11/21/2012 Discharge date: 11/22/2012  Admission Diagnoses: Open left tibia fibular fracture type IIIA  Discharge Diagnoses: Open left tibia fibular fracture type IIIa Active Problems:   * No active hospital problems. *   Discharged Condition: stable  Hospital Course: Patient's hospital course was essentially remarkable. Patient had difficulty with pain control do to his use of medications at home for her not prescription. Discussed the use of these non-prescribed narcotics and recommended against or use. He is given a prescription for Percocet 10 mg tablets.  Consults: None  Significant Diagnostic Studies: labs: Routine labs  Treatments: surgery: See operative note  Discharge Exam: Blood pressure 131/59, pulse 83, temperature 98.9 F (37.2 C), temperature source Oral, resp. rate 16, height 6\' 2"  (1.88 m), weight 83.915 kg (185 lb), SpO2 97.00%. Incision/Wound: dressing clean dry and intact at time of discharge. Patient had good pulses his foot was neurovascularly intact. Leg is soft no signs of compartment syndrome.  Disposition: Final discharge disposition not confirmed  Discharge Orders   Future Orders Complete By Expires     Call MD / Call 911  As directed     Comments:      If you experience chest pain or shortness of breath, CALL 911 and be transported to the hospital emergency room.  If you develope a fever above 101 F, pus (white drainage) or increased drainage or redness at the wound, or calf pain, call your surgeon's office.    Constipation Prevention  As directed     Comments:      Drink plenty of fluids.  Prune juice may be helpful.  You may use a stool softener, such as Colace (over the counter) 100 mg twice a day.  Use MiraLax (over the counter) for constipation as needed.    Diet - low sodium heart healthy  As directed     Increase activity slowly  as tolerated  As directed     Non weight bearing  As directed     Scheduling Instructions:      Left leg        Medication List    TAKE these medications       oxyCODONE-acetaminophen 10-325 MG per tablet  Commonly known as:  PERCOCET  Take 1 tablet by mouth every 4 (four) hours as needed for pain.           Follow-up Information   Follow up with DUDA,MARCUS V, MD In 2 weeks.   Contact information:   [address] [place] [address] [phone]       Signed: Nadara Mustard 11/22/2012, 7:53 AM

## 2012-11-22 NOTE — Progress Notes (Signed)
Orthopedic Tech Progress Note Patient Details:  BALEN WOOLUM 1986/04/15 [id number]  Ortho Devices Type of Ortho Device: Crutches Ortho Device/Splint Location: left foot Ortho Device/Splint Interventions: Application   Cammer, Mickie Bail 11/22/2012, 11:33 AM

## 2012-11-24 ENCOUNTER — Encounter (HOSPITAL_COMMUNITY): Payer: Self-pay | Admitting: Orthopedic Surgery

## 2012-12-19 ENCOUNTER — Emergency Department (HOSPITAL_COMMUNITY)
Admission: EM | Admit: 2012-12-19 | Discharge: 2012-12-19 | Discharge status: Against Medical Advice | Payer: MEDICAID | Attending: Emergency Medicine | Admitting: Emergency Medicine

## 2012-12-19 ENCOUNTER — Encounter (HOSPITAL_COMMUNITY): Payer: Self-pay | Admitting: Nurse Practitioner

## 2012-12-19 DIAGNOSIS — M25569 Pain in unspecified knee: Secondary | ICD-10-CM | POA: Insufficient documentation

## 2012-12-19 NOTE — ED Notes (Signed)
Pt reprots fx to LLE 3 weeks ago, missed his f/u appt with ortho MD this week, and has been having L knee pain and swelling over past week.

## 2012-12-19 NOTE — ED Notes (Signed)
Unable to locate pt for xray

## 2012-12-19 NOTE — ED Notes (Signed)
Unable to locate pt  

## 2012-12-19 NOTE — ED Notes (Signed)
Unable to locate the pt x 3 

## 2014-07-28 IMAGING — RF DG C-ARM 61-120 MIN
1 series · 6 of 6 positions shown · non-contrast
Comparison: Left tibia and fibula radiographs - earlier same day

CLINICAL DATA: IM nail of the left tibia

DG C-ARM 61-120 MIN, LEFT TIBIA AND FIBULA - 2 VIEW

[Series 1: run · 6 of 6 slices shown]
[im 1/6]
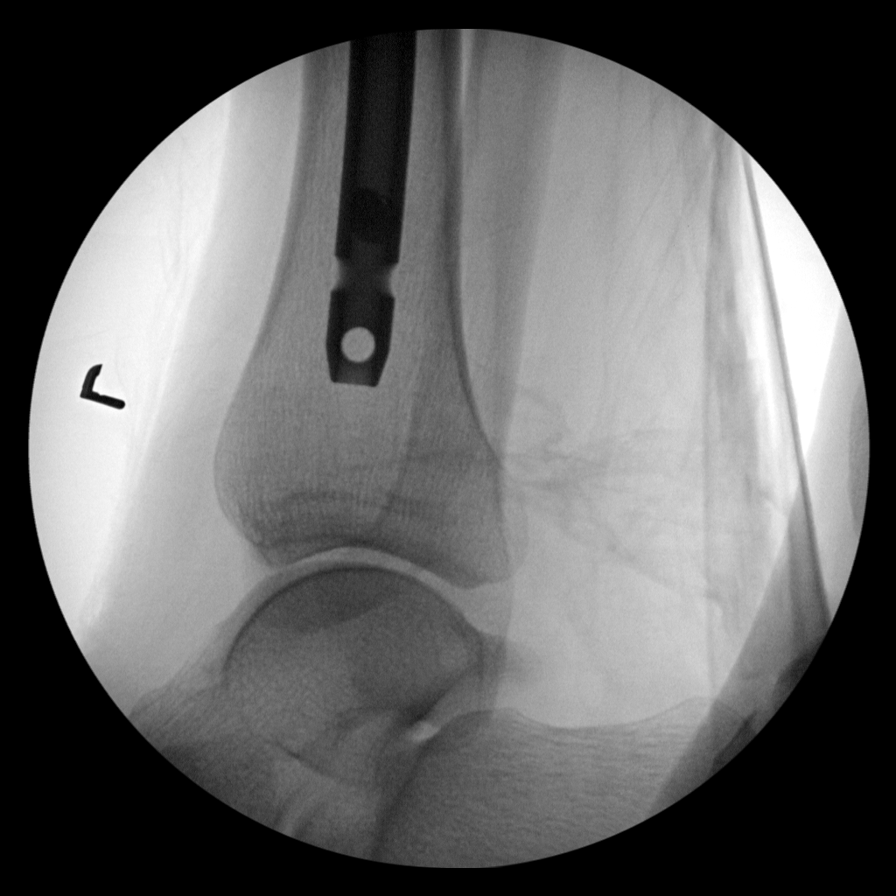
[im 2/6]
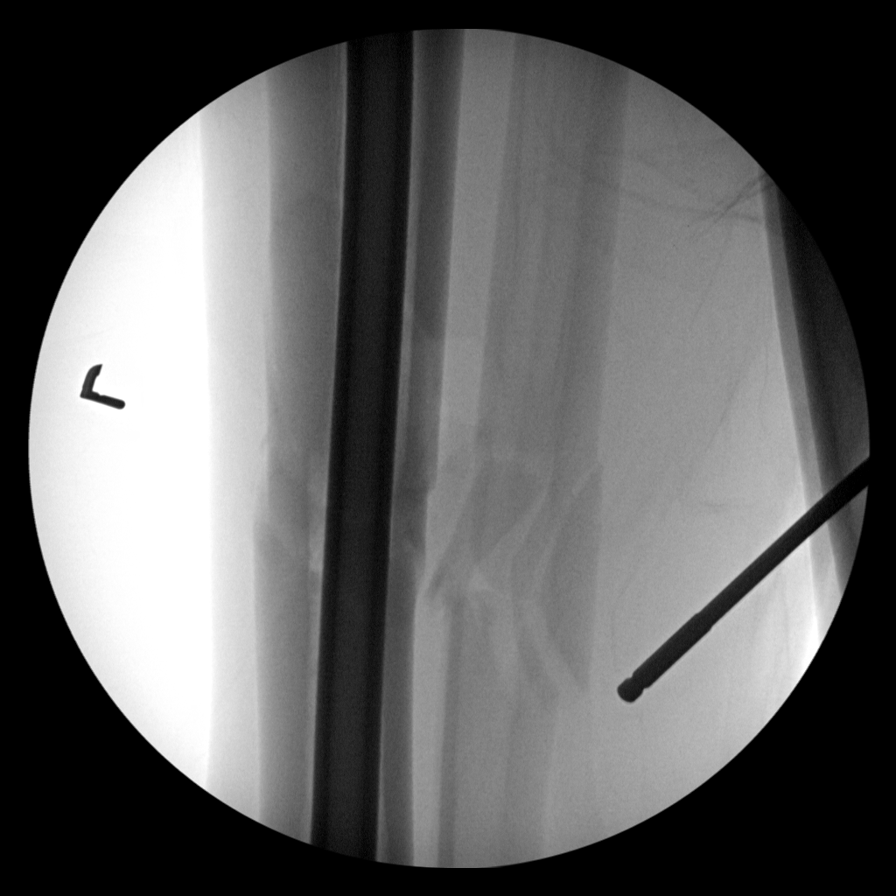
[im 3/6]
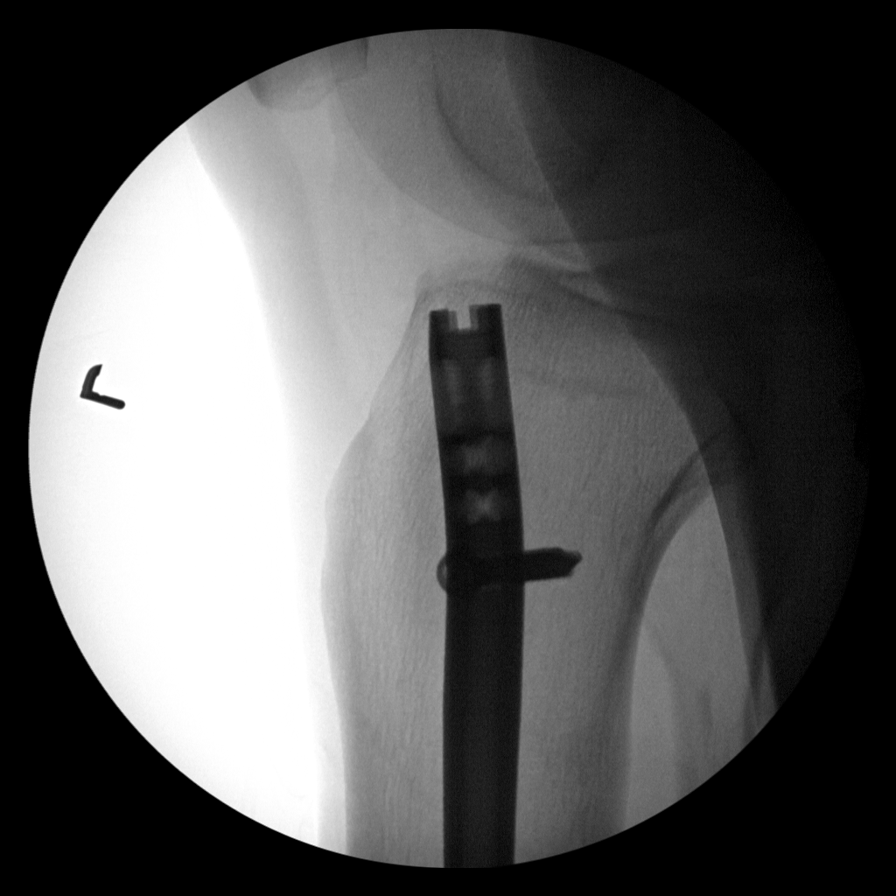
[im 4/6]
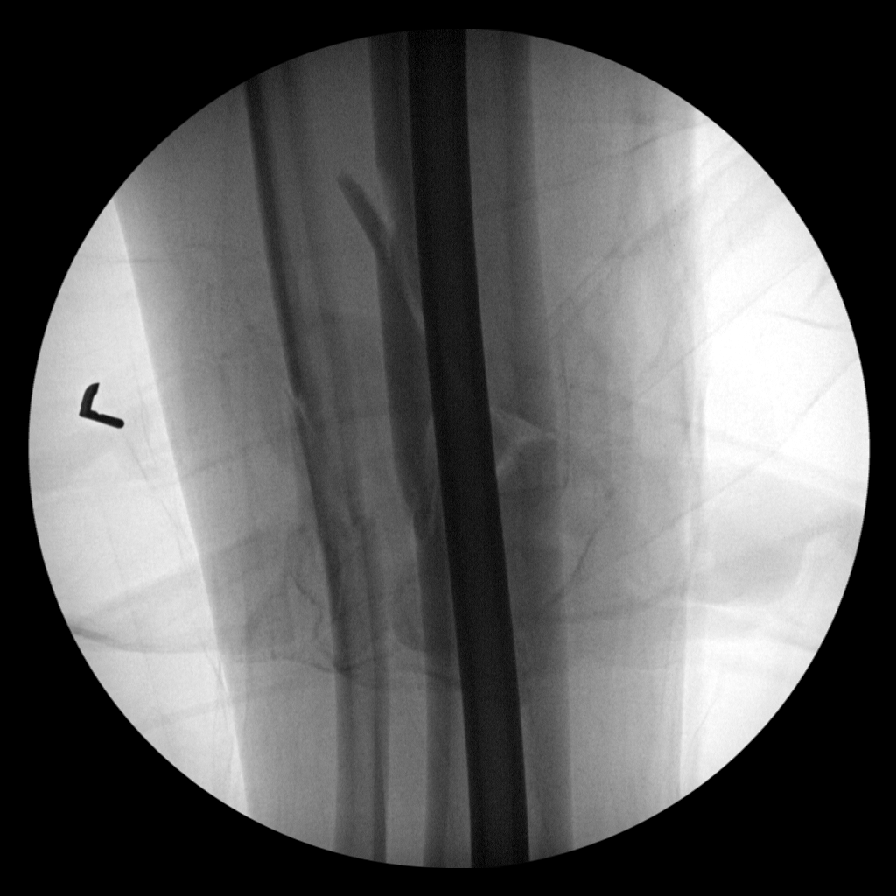
[im 5/6]
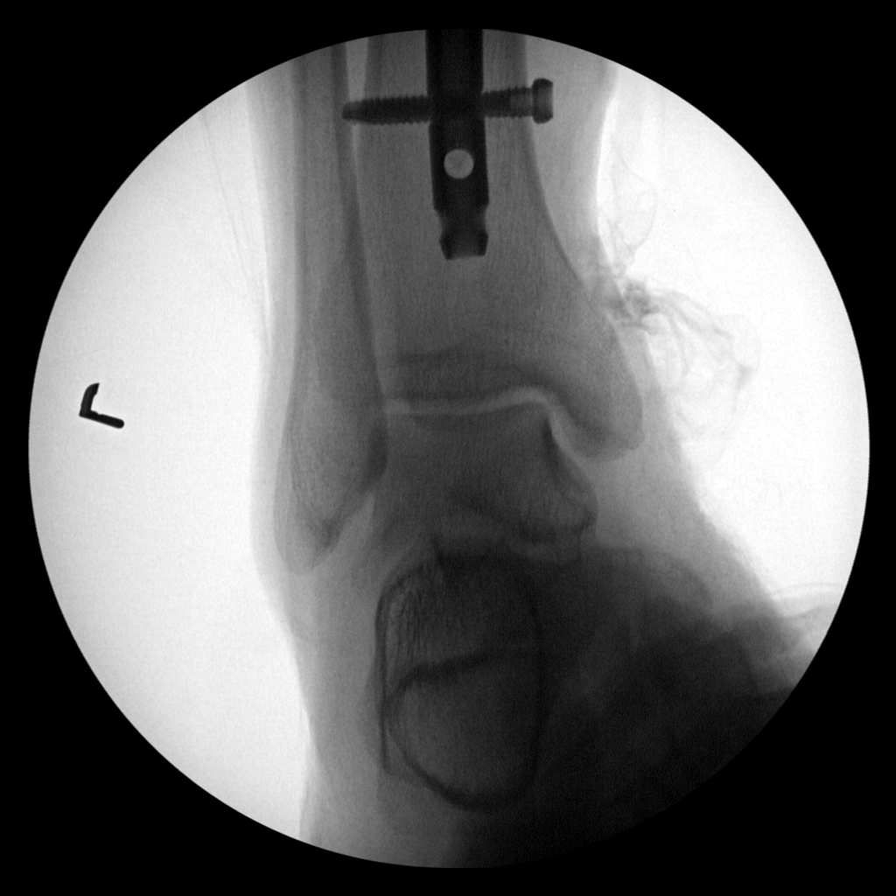
[im 6/6]
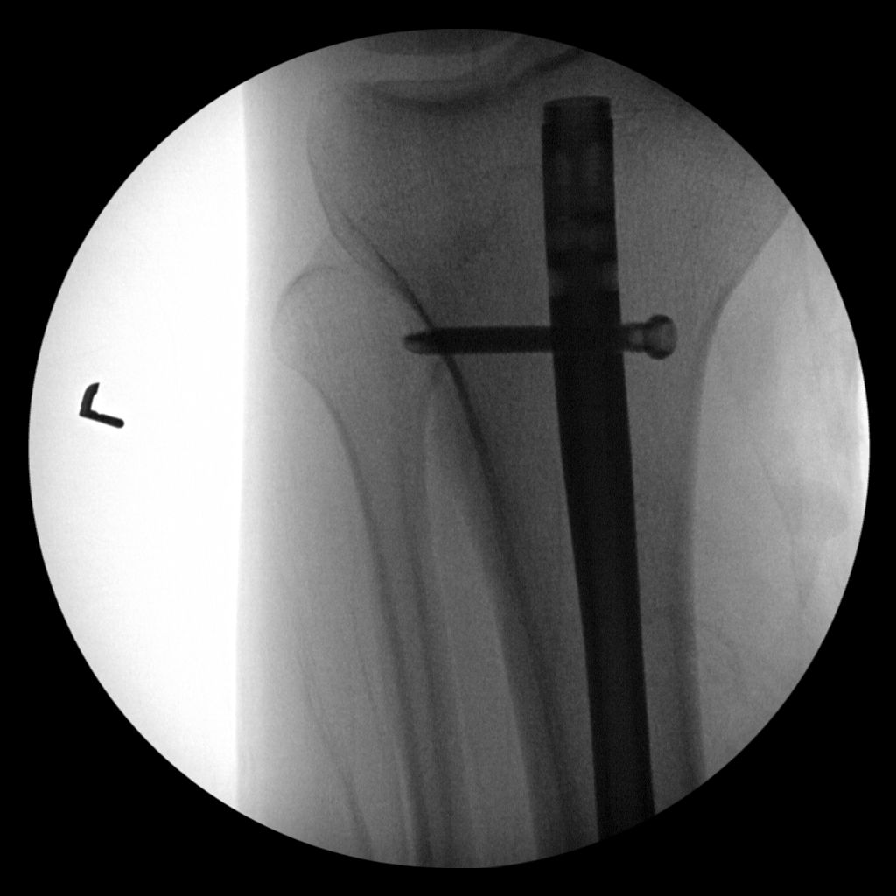

[6 of 6 positions shown; findings below may reference images not displayed]

FINDINGS: Six spot fluoroscopic intraoperative images of the left tibia and
fibula are provided for review.  Images demonstrate intramedullary
rod fixation of previously noted comminuted, displaced midshaft
tibial fracture.  Alignment appears near anatomic.  Improved
alignment of previously noted adjacent mid shaft comminuted fibular
fracture without dedicated ORIF.  No definite radiopaque foreign
bodies.
IMPRESSION: Post IM rod fixation of previously noted comminuted midshaft tibial
fracture.

## 2017-01-28 DIAGNOSIS — N179 Acute kidney failure, unspecified: Secondary | ICD-10-CM

## 2017-01-28 DIAGNOSIS — F1990 Other psychoactive substance use, unspecified, uncomplicated: Secondary | ICD-10-CM

## 2017-01-28 DIAGNOSIS — A419 Sepsis, unspecified organism: Secondary | ICD-10-CM

## 2017-01-29 DIAGNOSIS — R509 Fever, unspecified: Secondary | ICD-10-CM

## 2017-01-30 NOTE — Progress Notes (Addendum)
    Spoke w/ Dr Demetrius CharityP. Singh by phone.  [facility], needs TEE to r/o bacterial endocarditis. Dr Thedore MinsSingh will arrange transfer on Monday. He will discuss the risks and benefits of the procedure. Procedure is set up for Tuesday, August 7, at 10:00 am with Dr Mayford Knifeurner. Orders written. Case number [id number].  Theodore DemarkBarrett, Arman Loy, Cordelia Poche-C 01/30/2017 11:25 AM Beeper [phone]

## 2017-02-03 ENCOUNTER — Ambulatory Visit (HOSPITAL_COMMUNITY): Admission: RE | Admit: 2017-02-03 | Payer: Self-pay | Source: Ambulatory Visit

## 2017-02-03 ENCOUNTER — Encounter (HOSPITAL_COMMUNITY): Payer: Self-pay | Admitting: Certified Registered Nurse Anesthetist

## 2017-02-03 ENCOUNTER — Encounter (HOSPITAL_COMMUNITY): Admission: RE | Payer: Self-pay | Source: Ambulatory Visit

## 2017-02-03 ENCOUNTER — Ambulatory Visit (HOSPITAL_COMMUNITY): Admission: RE | Admit: 2017-02-03 | Payer: Self-pay | Source: Ambulatory Visit | Admitting: Cardiology

## 2017-02-03 SURGERY — ECHOCARDIOGRAM, TRANSESOPHAGEAL
Anesthesia: Monitor Anesthesia Care

## 2017-02-03 NOTE — Progress Notes (Signed)
Patient unable to be reached by telephone. Patient no show day of procedure. Cancelled TEE. Dr. Mayford Knifeurner made aware.

## 2017-02-03 NOTE — Progress Notes (Signed)
Attempted to call patient this morning regarding patient's scheduled TEE at 10 am on 02/03/17. The phone number is not in service and therefore I was unable to reach patient.

## 2018-01-05 ENCOUNTER — Encounter (HOSPITAL_COMMUNITY): Payer: Self-pay | Admitting: Emergency Medicine

## 2018-01-05 ENCOUNTER — Emergency Department (HOSPITAL_COMMUNITY)
Admission: EM | Admit: 2018-01-05 | Discharge: 2018-01-05 | Disposition: A | Discharge status: Home / Self Care | Payer: Self-pay | Attending: Emergency Medicine | Admitting: Emergency Medicine

## 2018-01-05 ENCOUNTER — Other Ambulatory Visit: Payer: Self-pay

## 2018-01-05 DIAGNOSIS — F17210 Nicotine dependence, cigarettes, uncomplicated: Secondary | ICD-10-CM | POA: Insufficient documentation

## 2018-01-05 DIAGNOSIS — Y998 Other external cause status: Secondary | ICD-10-CM | POA: Insufficient documentation

## 2018-01-05 DIAGNOSIS — W270XXA Contact with workbench tool, initial encounter: Secondary | ICD-10-CM | POA: Insufficient documentation

## 2018-01-05 DIAGNOSIS — S61512A Laceration without foreign body of left wrist, initial encounter: Secondary | ICD-10-CM | POA: Insufficient documentation

## 2018-01-05 DIAGNOSIS — Y9389 Activity, other specified: Secondary | ICD-10-CM | POA: Insufficient documentation

## 2018-01-05 DIAGNOSIS — Y929 Unspecified place or not applicable: Secondary | ICD-10-CM | POA: Insufficient documentation

## 2018-01-05 MED ORDER — LIDOCAINE-EPINEPHRINE (PF) 2 %-1:200000 IJ SOLN
20.0000 mL | Freq: Once | INTRAMUSCULAR | Status: AC
  Administered 2018-01-05: 20 mL
  Filled 2018-01-05: qty 20

## 2018-01-05 NOTE — ED Notes (Signed)
Pt's girlfriend to nurse's desk stating "EMS told us to come here over six hours ago. We might just go somewhere else." MD aware.

## 2018-01-05 NOTE — ED Provider Notes (Signed)
[facility] EM[facility] Provider Note   CSN: [id number] Arrival date & time: 01/05/18  0031     History   Chief Complaint Chief Complaint  Patient presents with  . Extremity Laceration    HPI Douglas Cummings is a 32 y.o. male.  Patient presents to the emergency department for evaluation of left wrist injury.  Patient reports that he was working with a brand-new wood chisel when the chisel slipped off the wood and accidentally stabbed into his left wrist.  He reports that there was a gush of blood and it was spurting at the scene.  He applied direct pressure and came to the hospital.  He reports that his fourth and fifth fingers are numb and he cannot move them, he immediately felt numbness from the hand all the way up to the shoulder on the left side after the injury.     History reviewed. No pertinent past medical history.  There are no active problems to display for this patient.   Past Surgical History:  Procedure Laterality Date  . I&D EXTREMITY Left 11/21/2012   Procedure: IRRIGATION AND DEBRIDEMENT EXTREMITY;  Surgeon: Nadara MustardMarcus V Duda, MD;  Location: [facility] OR;  Service: Orthopedics;  Laterality: Left;  open wound left tibia  . TIBIA IM NAIL INSERTION Left 11/21/2012   Procedure: INTRAMEDULLARY (IM) NAIL TIBIAL;  Surgeon: Nadara MustardMarcus V Duda, MD;  Location: [facility] OR;  Service: Orthopedics;  Laterality: Left;        Home Medications    Prior to Admission medications   Medication Sig Start Date End Date Taking? Authorizing Provider  oxyCODONE-acetaminophen (PERCOCET) 10-325 MG per tablet Take 1 tablet by mouth every 4 (four) hours as needed for pain. 11/22/12   Nadara Mustarduda, Marcus V, MD    Family History No family history on file.  Social History Social History   Tobacco Use  . Smoking status: Current Every Day Smoker    Types: Cigarettes  . Smokeless tobacco: Never Used  Substance Use Topics  . Alcohol use: No  . Drug use: No     Allergies     Patient has no known allergies.   Review of Systems Review of Systems  Skin: Positive for wound.  Neurological: Positive for numbness.  All other systems reviewed and are negative.    Physical Exam Updated Vital Signs BP 125/89 (BP Location: Right Arm)   Pulse 71   Temp 98.1 F (36.7 C) (Oral)   Resp 16   Ht 6\' 2"  (1.88 m)   Wt 83.9 kg (185 lb)   SpO2 100%   BMI 23.75 kg/m   Physical Exam  Constitutional: He appears well-developed and well-nourished.  HENT:  Head: Atraumatic.  Eyes: Pupils are equal, round, and reactive to light.  Cardiovascular: Normal rate.  Pulmonary/Chest: Effort normal and breath sounds normal.  Musculoskeletal:       Left wrist: He exhibits laceration (1.5cm volar wrist).  4th and 5th finger held in flexion, pain with extension     ED Treatments / Results  Labs (all labs ordered are listed, but only abnormal results are displayed) Labs Reviewed - No data to display  EKG None  Radiology No results found.  Procedures Procedures (including critical care time)  Medications Ordered in ED Medications  lidocaine-EPINEPHrine (XYLOCAINE W/EPI) 2 %-1:200000 (PF) injection 20 mL (20 mLs Infiltration Given 01/05/18 0152)     Initial Impression / Assessment and Plan / ED Course  I have reviewed the triage vital signs and  the nursing notes.  Pertinent labs & imaging results that were available during my care of the patient were reviewed by me and considered in my medical decision making (see chart for details).     Patient had penetrating laceration to the left wrist.  This was in the volar aspect, to the ulnar side.  He reported a large amount of bleeding at the scene, the wound was undressed and there was no active bleeding currently.  Radial pulses strong, ulnar process was also palpable lateral to the laceration.  Patient was holding his fourth and fifth fingers in flexion secondary to pain, he was able to flex them, doubt tendon  injury.  Exploration and repair was planned but delayed secondary to high acuity in the ER.  When I went back to the room to perform the repair, patient had eloped.  Final Clinical Impressions(s) / ED Diagnoses   Final diagnoses:  Laceration of left wrist, initial encounter    ED Discharge Orders    None       Earlyn Sylvan, Canary Brim, MD 01/05/18 [id number]

## 2018-01-05 NOTE — ED Notes (Signed)
Pt exited from room stating they were not waiting any longer and were going elsewhere

## 2018-01-05 NOTE — ED Triage Notes (Signed)
Pt states while cutting cedar box with wood chisel the chisel slipped stabbing pt in L wrist. Pt states he is uanble to straighten 4 and 5th fingers. Pt reports numbness up to shoulder. Hand wrapped with clean and dry dressing by EMS
